# Patient Record
Sex: Female | Born: 2005
Health system: Southern US, Community
[De-identification: ages and names within clinical notes are randomized; demographics above are authoritative.]

## PROBLEM LIST (undated history)

## (undated) DIAGNOSIS — F419 Anxiety disorder, unspecified: Secondary | ICD-10-CM

## (undated) DIAGNOSIS — D649 Anemia, unspecified: Secondary | ICD-10-CM

## (undated) HISTORY — PX: NO PAST SURGERIES: SHX2092

## (undated) HISTORY — DX: Anemia, unspecified: D64.9

## (undated) HISTORY — DX: Anxiety disorder, unspecified: F41.9

---
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
## 2023-01-10 DIAGNOSIS — Z3483 Encounter for supervision of other normal pregnancy, third trimester: Secondary | ICD-10-CM | POA: Diagnosis not present

## 2023-01-10 DIAGNOSIS — Z3482 Encounter for supervision of other normal pregnancy, second trimester: Secondary | ICD-10-CM | POA: Diagnosis not present

## 2023-05-29 DIAGNOSIS — D2272 Melanocytic nevi of left lower limb, including hip: Secondary | ICD-10-CM | POA: Diagnosis not present

## 2023-05-29 DIAGNOSIS — L905 Scar conditions and fibrosis of skin: Secondary | ICD-10-CM | POA: Diagnosis not present

## 2023-08-24 ENCOUNTER — Encounter: Payer: Self-pay | Admitting: Advanced Practice Midwife

## 2023-11-12 ENCOUNTER — Encounter: Payer: Self-pay | Admitting: Obstetrics and Gynecology

## 2023-11-12 ENCOUNTER — Telehealth: Payer: Self-pay | Admitting: *Deleted

## 2023-11-12 NOTE — Telephone Encounter (Signed)
 Returned call from 10:06 AM. Left patient a message that appointment she scheduled online is fine.

## 2023-11-15 ENCOUNTER — Other Ambulatory Visit (HOSPITAL_COMMUNITY)
Admission: RE | Admit: 2023-11-15 | Discharge: 2023-11-15 | Disposition: A | Discharge status: Home / Self Care | Source: Ambulatory Visit | Attending: Obstetrics and Gynecology | Admitting: Obstetrics and Gynecology

## 2023-11-15 ENCOUNTER — Encounter: Payer: Self-pay | Admitting: Obstetrics and Gynecology

## 2023-11-15 ENCOUNTER — Other Ambulatory Visit (HOSPITAL_BASED_OUTPATIENT_CLINIC_OR_DEPARTMENT_OTHER): Payer: Self-pay

## 2023-11-15 ENCOUNTER — Ambulatory Visit: Admitting: Obstetrics and Gynecology

## 2023-11-15 VITALS — BP 107/73 | HR 105 | Ht 66.0 in | Wt 93.0 lb

## 2023-11-15 DIAGNOSIS — N926 Irregular menstruation, unspecified: Secondary | ICD-10-CM

## 2023-11-15 DIAGNOSIS — R1030 Lower abdominal pain, unspecified: Secondary | ICD-10-CM | POA: Diagnosis not present

## 2023-11-15 DIAGNOSIS — Z30011 Encounter for initial prescription of contraceptive pills: Secondary | ICD-10-CM

## 2023-11-15 LAB — POCT URINALYSIS DIPSTICK
Bilirubin, UA: NEGATIVE
Blood, UA: NEGATIVE
Glucose, UA: NEGATIVE
Ketones, UA: NEGATIVE
Leukocytes, UA: NEGATIVE
Nitrite, UA: NEGATIVE
Protein, UA: NEGATIVE
Spec Grav, UA: 1.02 (ref 1.010–1.025)
Urobilinogen, UA: 0.2 U/dL
pH, UA: 7 (ref 5.0–8.0)

## 2023-11-15 LAB — POCT URINE PREGNANCY: Preg Test, Ur: NEGATIVE

## 2023-11-15 MED ORDER — LO LOESTRIN FE 1 MG-10 MCG / 10 MCG PO TABS
1.0000 | ORAL_TABLET | Freq: Every day | ORAL | 3 refills | Status: AC
  Filled 2023-11-15 (×2): qty 84, 84d supply, fill #0
  Filled 2024-02-19: qty 84, 84d supply, fill #1

## 2023-11-15 NOTE — Progress Notes (Cosign Needed Addendum)
 GYN VISIT Patient name: Leslie Zimmerman MRN [id number]  Date of birth: [birth year] Chief Complaint:   Abdominal pain  History of Present Illness:   Leslie Zimmerman is a 18 y.o. G31P1001 Caucasian female being seen today for new onset abdominal pain. Husband and child were sick with GI virus around 11/01/23; she had 4 days of severe lower abdominal pain without vomiting or bowel changes during this time. Then approx 5 days ago she experienced 1 day of severe abdominal pain. Describes pain as diffuse over lower abdomen, constant, with intermittent sharp pain. Reports positive UPT on 11/08/23, followed by negative UPT on 11/10/23 and 11/13/23. Denies vaginal irritation or odor. Reports mild constipation and increased urinary frequency.  Patient's last menstrual period was 10/27/2023 (approximate).  The current method of family planning is condoms.  Last pap: NA (age <21)  Review of Systems:   Pertinent items are noted in HPI Denies fever/chills, dizziness, headaches, visual disturbances, fatigue, shortness of breath, chest pain, abdominal pain, vomiting, abnormal vaginal discharge/itching/odor/irritation, problems with periods, bowel movements, urination, or intercourse unless otherwise stated above.  Pertinent History Reviewed:  Reviewed past medical,surgical, social, obstetrical and family history.  Reviewed problem list, medications and allergies. Physical Assessment:   Vitals:   11/15/23 1510  BP: 107/73  Pulse: (!) 105  Weight: 42.2 kg  Height: 5' 6 (1.676 m)  Body mass index is 15.01 kg/m.       Physical Examination:   General appearance: alert, well appearing, and in no distress  Mental status: alert, oriented to person, place, and time  Skin: warm & dry   Cardiovascular: normal heart rate noted  Respiratory: normal respiratory effort, no distress  Abdomen: soft, mild tenderness in LLQ  Pelvic: deferred  Extremities: no edema   Chaperone: N/A    Results for orders  placed or performed in visit on 11/15/23 (from the past 24 hours)  POCT Urinalysis Dipstick   Collection Time: 11/15/23  3:49 PM  Result Value Ref Range   Color, UA     Clarity, UA     Glucose, UA Negative Negative   Bilirubin, UA neg    Ketones, UA neg    Spec Grav, UA 1.020 1.010 - 1.025   Blood, UA neg    pH, UA 7.0 5.0 - 8.0   Protein, UA Negative Negative   Urobilinogen, UA 0.2 0.2 or 1.0 E.U./dL   Nitrite, UA neg    Leukocytes, UA Negative Negative   Appearance     Odor    POCT urine pregnancy   Collection Time: 11/15/23  3:51 PM  Result Value Ref Range   Preg Test, Ur Negative Negative    Assessment & Plan:   1. Lower abdominal pain - Tenderness in LLQ - POCT Urinalysis Dipstick > no signs of UTI - Cervicovaginal ancillary only > results pending for vaginal infection - Discussed possibility of constipation causing abdominal pain - UPT today due to abdominal pain with positive home UPT 1 week prior - POCT urine pregnancy > NEGATIVE - Offered pelvic US  vs expectant management > patient would prefer to monitor symptoms and follow-up in 3 months  2. Irregular menstrual cycle -  Menstrual cycle initially irregular after birth in 2024, becoming more regular over the past few months   3. Encounter for oral contraception initial prescription - Feels significant anxiety about possibility of becoming pregnant while using condoms, reviewed available options including LARCs - Would like to begin OCPs; reviewed risks, benefits, and side effects -  Norethindrone-Ethinyl Estradiol-Fe Biphas (LO LOESTRIN FE) 1 MG-10 MCG / 10 MCG tablet; Take 1 tablet by mouth daily.  Dispense: 84 tablet; Refill: 3  Meds:  Meds ordered this encounter  Medications   Norethindrone-Ethinyl Estradiol-Fe Biphas (LO LOESTRIN FE) 1 MG-10 MCG / 10 MCG tablet    Sig: Take 1 tablet by mouth daily.    Dispense:  84 tablet    Refill:  3   Orders Placed This Encounter  Procedures   POCT urine pregnancy    POCT Urinalysis Dipstick   Return in about 3 months (around 02/15/2024) for ocp follow-up, abd pain.  Vernell Ruddle, SNM 11/15/2023 5:14 PM

## 2023-11-16 LAB — CERVICOVAGINAL ANCILLARY ONLY
Bacterial Vaginitis (gardnerella): NEGATIVE
Chlamydia: NEGATIVE
Comment: NEGATIVE
Comment: NEGATIVE
Comment: NEGATIVE
Comment: NORMAL
Neisseria Gonorrhea: NEGATIVE
Trichomonas: NEGATIVE

## 2023-11-19 ENCOUNTER — Ambulatory Visit: Payer: Self-pay | Admitting: Obstetrics and Gynecology

## 2023-11-19 ENCOUNTER — Other Ambulatory Visit (HOSPITAL_BASED_OUTPATIENT_CLINIC_OR_DEPARTMENT_OTHER): Payer: Self-pay

## 2023-11-19 ENCOUNTER — Encounter: Payer: Self-pay | Admitting: Obstetrics and Gynecology

## 2023-11-21 ENCOUNTER — Other Ambulatory Visit: Payer: Self-pay

## 2023-12-04 ENCOUNTER — Encounter (INDEPENDENT_AMBULATORY_CARE_PROVIDER_SITE_OTHER): Admitting: Obstetrics and Gynecology

## 2023-12-04 NOTE — Progress Notes (Unsigned)
 Patient did not keep her appt today.SABRA  Dorita Delon FERNS, NP 12/04/2023 9:19 AM

## 2024-02-27 ENCOUNTER — Encounter: Payer: Self-pay | Admitting: Family Medicine

## 2024-02-27 ENCOUNTER — Ambulatory Visit: Admitting: Family Medicine

## 2024-02-27 VITALS — BP 103/68 | HR 89 | Ht 66.0 in | Wt 95.0 lb

## 2024-02-27 DIAGNOSIS — O99013 Anemia complicating pregnancy, third trimester: Secondary | ICD-10-CM | POA: Diagnosis not present

## 2024-02-27 DIAGNOSIS — Z0000 Encounter for general adult medical examination without abnormal findings: Secondary | ICD-10-CM

## 2024-02-27 DIAGNOSIS — F411 Generalized anxiety disorder: Secondary | ICD-10-CM | POA: Diagnosis not present

## 2024-02-27 DIAGNOSIS — M549 Dorsalgia, unspecified: Secondary | ICD-10-CM | POA: Diagnosis not present

## 2024-02-27 DIAGNOSIS — M419 Scoliosis, unspecified: Secondary | ICD-10-CM | POA: Diagnosis not present

## 2024-02-27 DIAGNOSIS — R5383 Other fatigue: Secondary | ICD-10-CM

## 2024-02-27 NOTE — Assessment & Plan Note (Signed)
 Stable, no concerns. No SI/HI. PHQ9/GAD7 reviewed.

## 2024-02-27 NOTE — Progress Notes (Signed)
 "  New Patient Office Visit  Subjective    Patient ID: Leslie Zimmerman, female    DOB: [birth year]  Age: 19 y.o. MRN: [id number]  CC:  Chief Complaint  Patient presents with   Establish Care    HPI Leslie Zimmerman presents to establish care. 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 She lives with her husband and 29-month old son.   Discussed the use of AI scribe software for clinical note transcription with the patient, who gave verbal consent to proceed.  History of Present Illness Leslie Zimmerman is a 19 year old female who presents with back pain.  She experiences significant intermittent back pain located in the lower to middle back, described as bothersome. She has not taken medication for it. She was informed of a slight scoliosis during an epidural placement when she had her son in July 2024. No prior x-rays of her back have been conducted. No numbness, tingling, or shooting pain associated with her back pain. She reports sleeping well at night.  She has a history of anemia, particularly noted during her pregnancy when she required iron  infusions. Her son was born in July 2024, and she was noted to be anemic in April 2024, improving by July 2024 but still technically anemic. She is not currently taking iron  supplements and feels 'a little bit' tired, which she attributes to having a young child.  Her past medical history includes anxiety and a previous eating disorder. She wears glasses or contacts and recently had her eyes checked. She is currently on birth control pills (Lo Loestrin FE ) and is allergic to penicillins.  Socially, she lives with her husband and 41-month-old son. 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 She denies the use of alcohol, drugs, tobacco, or nicotine.       02/27/2024    3:52 PM 07/25/2022    4:01 PM 03/08/2022    2:15 PM  PHQ9 SCORE ONLY  PHQ-9 Total Score 2 2  2       Data saved with a previous flowsheet row definition       02/27/2024    3:52 PM 07/25/2022    4:01 PM 03/08/2022    2:15 PM 09/01/2021    2:03 PM  GAD 7 : Generalized Anxiety Score  Nervous, Anxious, on Edge 1 1  1  1    Control/stop worrying 1 0  0  1   Worry too much - different things 1 0  0  1   Trouble relaxing 0 0  0  0   Restless 0 0  0  0   Easily annoyed or irritable 0 0  3  0   Afraid - awful might happen 1 0  1  1   Total GAD 7 Score 4 1 5 4   Anxiety Difficulty Somewhat difficult        Data saved with a previous flowsheet row definition           Outpatient Encounter Medications as of 02/27/2024  Medication Sig   Norethindrone-Ethinyl Estradiol -Fe Biphas (LO LOESTRIN FE ) 1 MG-10 MCG / 10 MCG tablet Take 1 tablet by mouth daily.   [DISCONTINUED] acetaminophen  (TYLENOL ) 325 MG tablet Take 2 tablets (650 mg total) by mouth every 4 (four) hours as needed (for pain scale < 4). (Patient not taking: Reported on 08/30/2022)   [DISCONTINUED] ferrous sulfate 325 (65 FE) MG EC tablet Take 325 mg by mouth 3 (three) times daily with meals. (Patient  not taking: Reported on 08/30/2022)   [DISCONTINUED] ibuprofen  (ADVIL ) 600 MG tablet Take 1 tablet (600 mg total) by mouth every 6 (six) hours. (Patient not taking: Reported on 08/30/2022)   [DISCONTINUED] prenatal vitamin w/FE, FA (PRENATAL 1 + 1) 27-1 MG TABS tablet Take 1 tablet by mouth daily at 12 noon. (Patient not taking: Reported on 08/30/2022)   No facility-administered encounter medications on file as of 02/27/2024.    Past Medical History:  Diagnosis Date   Anemia    Anxiety    Phreesia 03/24/2020   COVID-19 07/07/2020   no vaccine   Disorder of eye region 10/23/2019   Eating disorder 04/08/2020   Failed vision screen 07/14/2021   Frontal headache 05/12/2020    Past Surgical History:  Procedure Laterality Date   NO PAST SURGERIES      Family History  Problem Relation Age of Onset   Sjogren's syndrome Mother    Lupus Mother    Anxiety disorder Mother        zoloft     Cancer Father        prostate   Prostate cancer Father    Depression Sister        lexapro   Anxiety disorder Sister    ADD / ADHD Brother        concerta   Cancer Maternal Grandmother        skin   Osteoarthritis Maternal Grandmother    Cancer - Other Maternal Grandmother    Parkinson's disease Maternal Grandfather    Alcohol abuse Paternal Grandfather    Hypertension Neg Hx     Social History   Socioeconomic History   Marital status: Married    Spouse name: Not on file   Number of children: Not on file   Years of education: Not on file   Highest education level: Not on file  Occupational History   Not on file  Tobacco Use   Smoking status: Former   Smokeless tobacco: Never  Vaping Use   Vaping status: Former  Substance and Sexual Activity   Alcohol use: Never   Drug use: Never   Sexual activity: Yes    Birth control/protection: OCP  Other Topics Concern   Not on file  Social History Narrative   Not on file   Social Drivers of Health   Tobacco Use: Medium Risk (02/27/2024)   Patient History    Smoking Tobacco Use: Former    Smokeless Tobacco Use: Never    Passive Exposure: Not on Actuary Strain: Not on file  Food Insecurity: Low Risk (03/28/2023)   Received from [facility]   Epic    Within the past 12 months, you worried that your food would run out before you got money to buy more: Never true    Within the past 12 months, the food you bought just didn't last and you didn't have money to get more. : Never true  Transportation Needs: No Transportation Needs (03/28/2023)   Received from [facility]    In the past 12 months, has lack of reliable transportation kept you from medical appointments, meetings, work or from getting things needed for daily living? : No  Physical Activity: Not on file  Stress: Not on file  Social Connections: Not on file  Intimate Partner Violence: Not At Risk (08/22/2022)   Humiliation, Afraid,  Rape, and Kick questionnaire    Fear of Current or Ex-Partner: No    Emotionally Abused: No  Physically Abused: No    Sexually Abused: No  Depression (PHQ2-9): Low Risk (02/27/2024)   Depression (PHQ2-9)    PHQ-2 Score: 2  Alcohol Screen: Not on file  Housing: Low Risk (03/28/2023)   Received from [facility]   Epic    What is your living situation today?: I have a steady place to live    Think about the place you live. Do you have problems with any of the following? Choose all that apply:: None/None on this list  Utilities: Low Risk (03/28/2023)   Received from [facility]   Utilities    In the past 12 months has the electric, gas, oil, or water company threatened to shut off services in your home? : No  Health Literacy: Not on file    ROS All review of systems negative except what is listed in the HPI      Objective    BP 103/68   Pulse 89   Ht 5' 6 (1.676 m)   Wt 95 lb (43.1 kg)   SpO2 100%   BMI 15.33 kg/m   Physical Exam Vitals reviewed.  Constitutional:      General: She is not in acute distress.    Appearance: Normal appearance. She is not ill-appearing.  Cardiovascular:     Rate and Rhythm: Normal rate and regular rhythm.     Heart sounds: Normal heart sounds.  Pulmonary:     Effort: Pulmonary effort is normal.     Breath sounds: Normal breath sounds.  Musculoskeletal:       Back:     Comments: Slight curvature of mid/lower spine; some palpable muscle tension to right sided thoracic paraspinal muscles  Skin:    General: Skin is warm and dry.  Neurological:     Mental Status: She is alert and oriented to person, place, and time.  Psychiatric:        Mood and Affect: Mood normal.        Behavior: Behavior normal.        Thought Content: Thought content normal.        Judgment: Judgment normal.       +     Assessment & Plan:   Problem List Items Addressed This Visit       Active Problems   Generalized anxiety disorder   Stable, no  concerns. No SI/HI. PHQ9/GAD7 reviewed.       Anemia in pregnancy   Anemia during pregnancy required iron  infusions. Not currently on iron  supplements. Fatigue possibly related to anemia.  - Ordered blood work to assess current iron  levels.      Relevant Orders   CBC with Differential/Platelet   IBC + Ferritin   Other Visit Diagnoses       Fatigue, unspecified type    -  Primary Labs today   Relevant Orders   CBC with Differential/Platelet   IBC + Ferritin   TSH   Comprehensive metabolic panel with GFR   B12 and Folate Panel     Encounter for medical examination to establish care          Mid back pain     Scoliosis, unspecified scoliosis type, unspecified spinal region Intermittent mid back pain with slight scoliosis. No imaging or surgical history. Focus on strengthening and posture to prevent progression. - Referred to physical therapy for back strengthening and posture improvement. - Advised use of Aleve or ibuprofen  for pain management as needed. - Recommended use of heating pads for back  pain relief. - Will consider imaging and referral to spine specialist if no improvement with physical therapy.    Relevant Orders   Ambulatory referral to Physical Therapy                         Return for - pending results or sooner if needed, CPE in 6-12 months .   Waddell KATHEE Mon, NP   "

## 2024-02-27 NOTE — Assessment & Plan Note (Signed)
 Anemia during pregnancy required iron  infusions. Not currently on iron  supplements. Fatigue possibly related to anemia.  - Ordered blood work to assess current iron  levels.

## 2024-02-28 LAB — CBC WITH DIFFERENTIAL/PLATELET
Basophils Absolute: 0 K/uL (ref 0.0–0.1)
Basophils Relative: 0.5 % (ref 0.0–3.0)
Eosinophils Absolute: 0 K/uL (ref 0.0–0.7)
Eosinophils Relative: 0.6 % (ref 0.0–5.0)
HCT: 39.1 % (ref 36.0–46.0)
Hemoglobin: 13.2 g/dL (ref 12.0–15.0)
Lymphocytes Relative: 28.1 % (ref 12.0–46.0)
Lymphs Abs: 2.3 K/uL (ref 0.7–4.0)
MCHC: 33.7 g/dL (ref 30.0–36.0)
MCV: 84.7 fl (ref 78.0–100.0)
Monocytes Absolute: 0.4 K/uL (ref 0.1–1.0)
Monocytes Relative: 4.7 % (ref 3.0–12.0)
Neutro Abs: 5.3 K/uL (ref 1.4–7.7)
Neutrophils Relative %: 66.1 % (ref 43.0–77.0)
Platelets: 485 K/uL — ABNORMAL HIGH (ref 150.0–400.0)
RBC: 4.62 Mil/uL (ref 3.87–5.11)
RDW: 13 % (ref 11.5–14.6)
WBC: 8.1 K/uL (ref 4.5–10.5)

## 2024-02-28 LAB — IBC + FERRITIN
Ferritin: 63.7 ng/mL (ref 10.0–291.0)
Iron: 97 ug/dL (ref 42–145)
Saturation Ratios: 27.3 % (ref 20.0–50.0)
TIBC: 355.6 ug/dL (ref 250.0–450.0)
Transferrin: 254 mg/dL (ref 212.0–360.0)

## 2024-02-28 LAB — COMPREHENSIVE METABOLIC PANEL WITH GFR
ALT: 10 U/L (ref 3–35)
AST: 13 U/L (ref 5–37)
Albumin: 3.9 g/dL (ref 3.5–5.2)
Alkaline Phosphatase: 63 U/L (ref 39–117)
BUN: 13 mg/dL (ref 6–23)
CO2: 24 meq/L (ref 19–32)
Calcium: 8.9 mg/dL (ref 8.4–10.5)
Chloride: 109 meq/L (ref 96–112)
Creatinine, Ser: 0.73 mg/dL (ref 0.40–1.20)
GFR: 118.25 mL/min
Glucose, Bld: 83 mg/dL (ref 70–99)
Potassium: 3.7 meq/L (ref 3.5–5.1)
Sodium: 142 meq/L (ref 135–145)
Total Bilirubin: 0.2 mg/dL (ref 0.2–1.2)
Total Protein: 7.2 g/dL (ref 6.0–8.3)

## 2024-02-28 LAB — B12 AND FOLATE PANEL
Folate: 10.9 ng/mL
Vitamin B-12: 244 pg/mL (ref 211–911)

## 2024-02-28 LAB — TSH: TSH: 0.98 u[IU]/mL (ref 0.35–5.50)

## 2024-02-29 ENCOUNTER — Ambulatory Visit: Payer: Self-pay | Admitting: Family Medicine
